# Patient Record
Sex: Female | Born: 1975 | Hispanic: No
Health system: Southern US, Community
[De-identification: ages and names within clinical notes are randomized; demographics above are authoritative.]

---

## 2019-09-27 ENCOUNTER — Other Ambulatory Visit: Payer: Self-pay

## 2019-09-27 ENCOUNTER — Emergency Department (HOSPITAL_COMMUNITY)
Admission: EM | Admit: 2019-09-27 | Discharge: 2019-09-28 | Disposition: A | Discharge status: Home / Self Care | Payer: Self-pay | Attending: Emergency Medicine | Admitting: Emergency Medicine

## 2019-09-27 ENCOUNTER — Encounter (HOSPITAL_COMMUNITY): Payer: Self-pay

## 2019-09-27 ENCOUNTER — Emergency Department (HOSPITAL_COMMUNITY): Payer: Self-pay

## 2019-09-27 DIAGNOSIS — R519 Headache, unspecified: Secondary | ICD-10-CM | POA: Insufficient documentation

## 2019-09-27 DIAGNOSIS — Z5321 Procedure and treatment not carried out due to patient leaving prior to being seen by health care provider: Secondary | ICD-10-CM | POA: Insufficient documentation

## 2019-09-27 NOTE — ED Notes (Signed)
Patient no longer wanted to wait to be seen. 

## 2019-09-27 NOTE — ED Triage Notes (Signed)
Pt arrives POV for eval of HA onset x3 days. Pt reports pain from HA radiates down L side/shoulder. Worse w/ deep breath.

## 2021-05-17 IMAGING — DX DG CHEST 2V
2 series · 2 of 2 positions shown · non-contrast
Comparison: None.

CLINICAL DATA: Chest pain

EXAM:
CHEST - 2 VIEW

[chest pa]
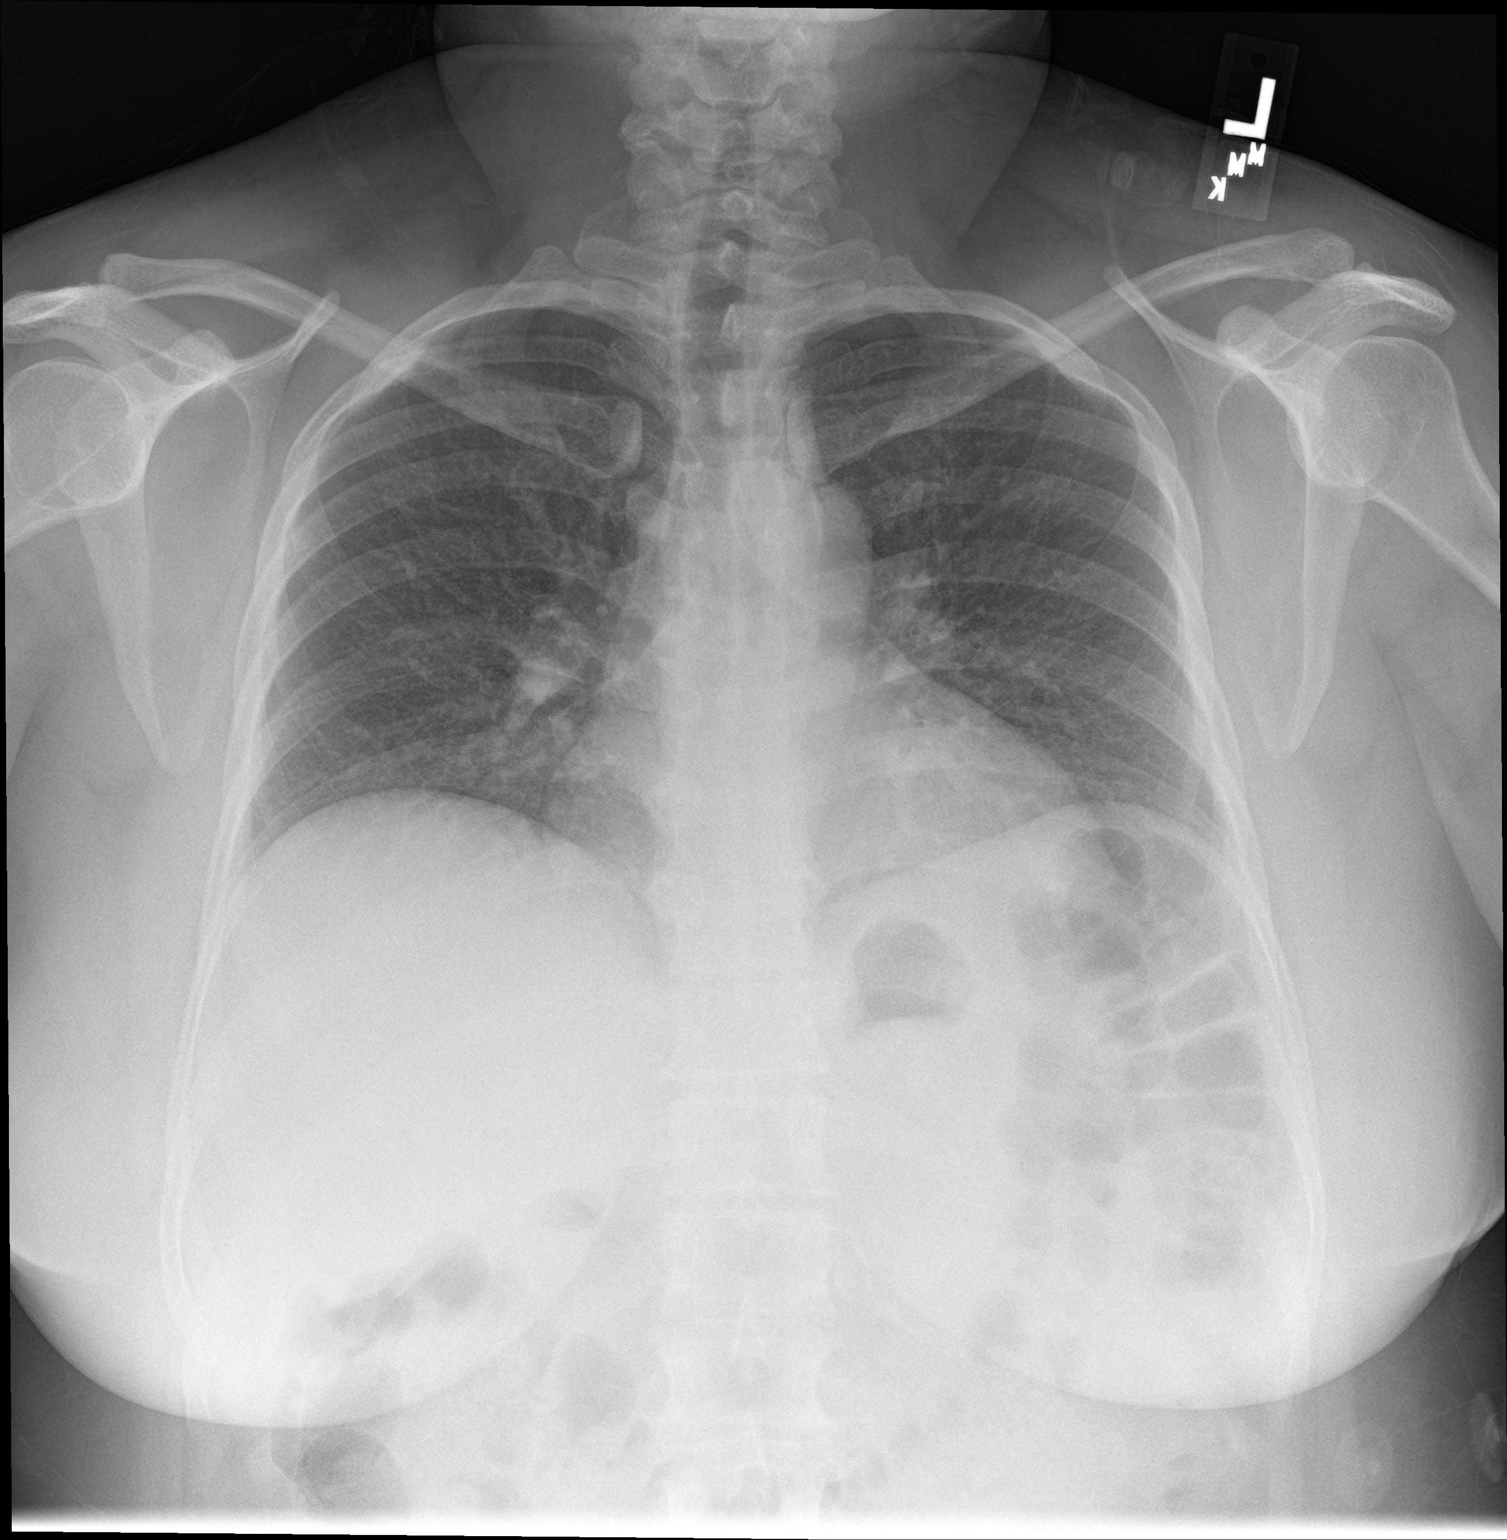

[chest lat]
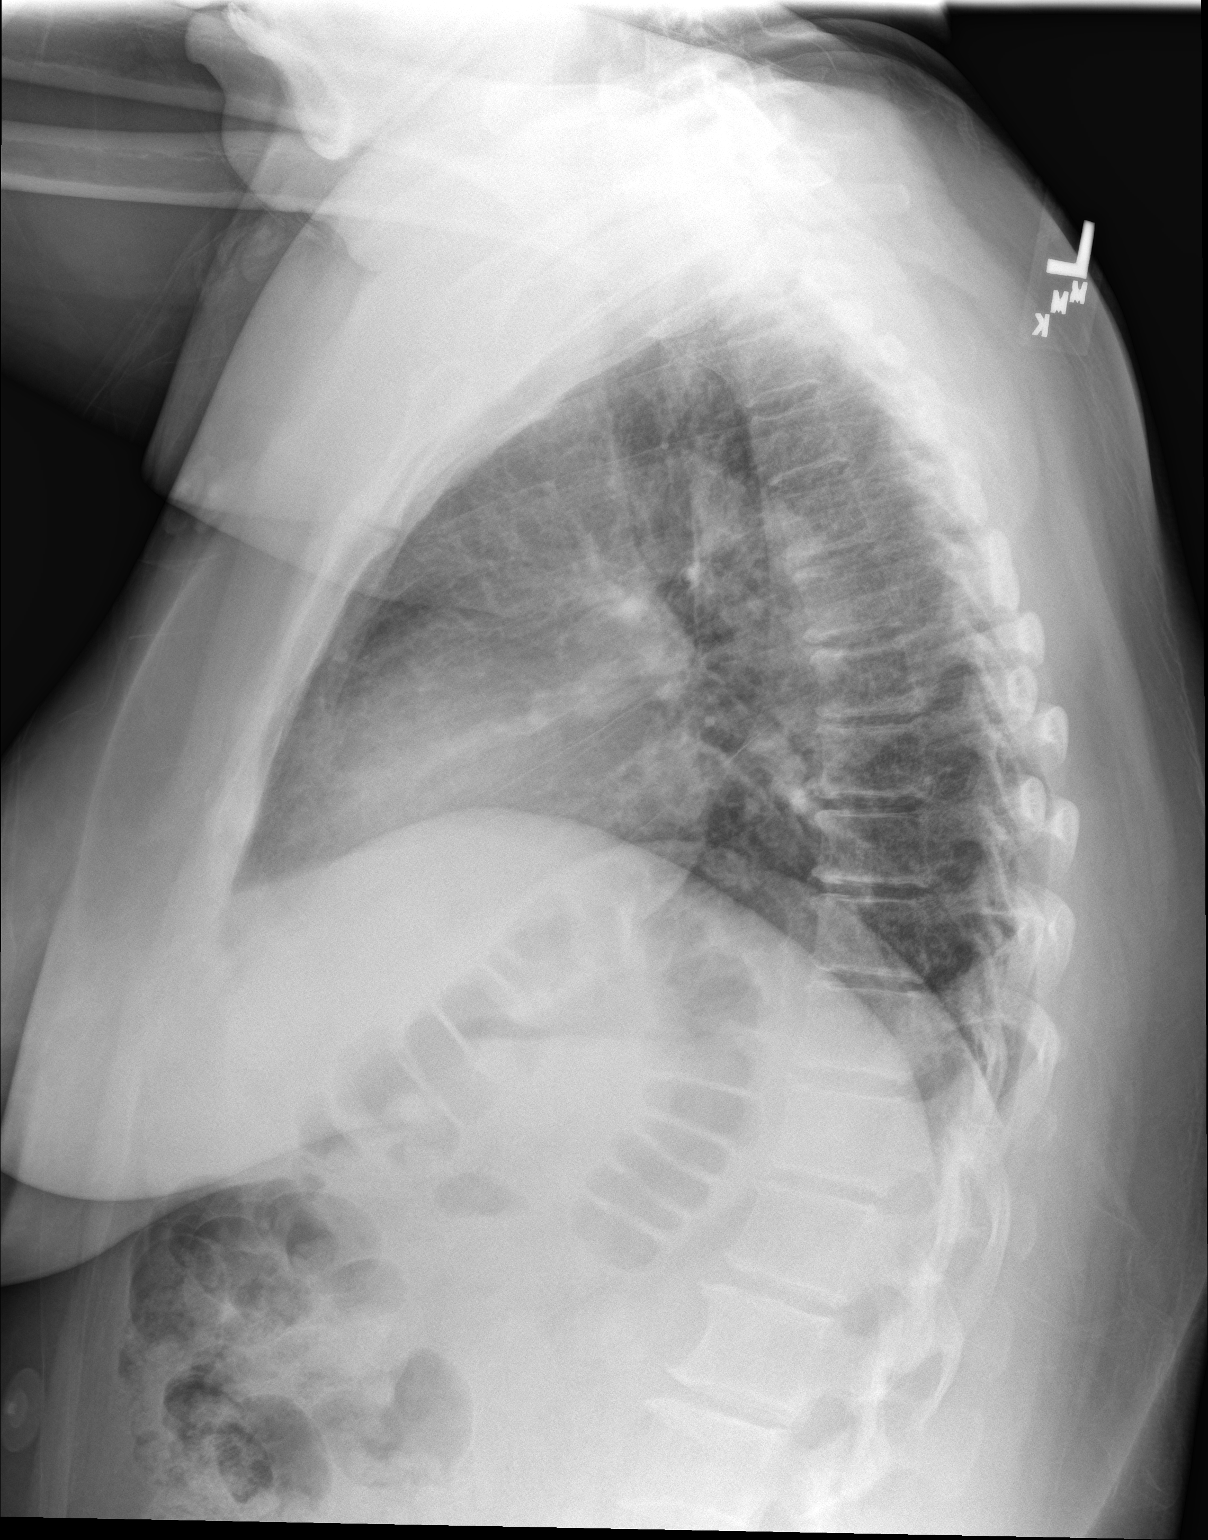

[2 of 2 positions shown; findings below may reference images not displayed]

FINDINGS: The heart size and mediastinal contours are within normal limits.
Low lung volumes. Both lungs are clear. The visualized skeletal
structures are unremarkable.
IMPRESSION: No active cardiopulmonary disease.
